# Patient Record
Sex: Female | Born: 1995 | Race: Black or African American | Hispanic: No | Marital: Single | Smoking: Never smoker
Health system: Southern US, Community
[De-identification: ages and names within clinical notes are randomized; demographics above are authoritative.]

---
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
## 2015-01-26 IMAGING — CT CT HEAD WITHOUT CONTRAST
1 series · 16 of 30 positions shown, 20 images · non-contrast
Comparison: None.

CLINICAL DATA: Possible seizure

EXAM:
CT HEAD WITHOUT CONTRAST
TECHNIQUE: Contiguous axial images were obtained from the base of the skull
through the vertex without intravenous contrast.

[Series 2: head wo · axial · 0.40mm/px · z∈[-181,-55]mm · 16 of 32 slices shown, 20 images]
[im 2/32  brain]
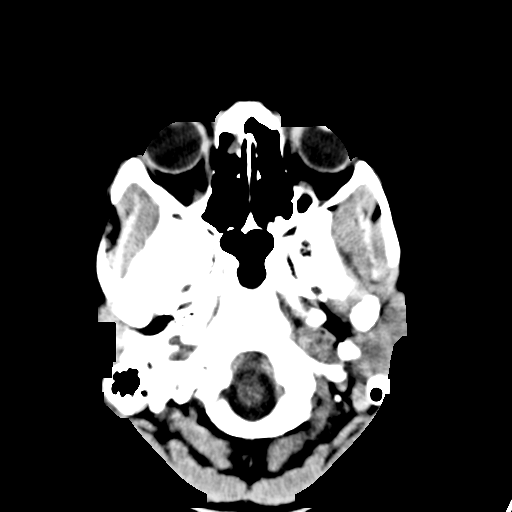
[im 2/32  bone]
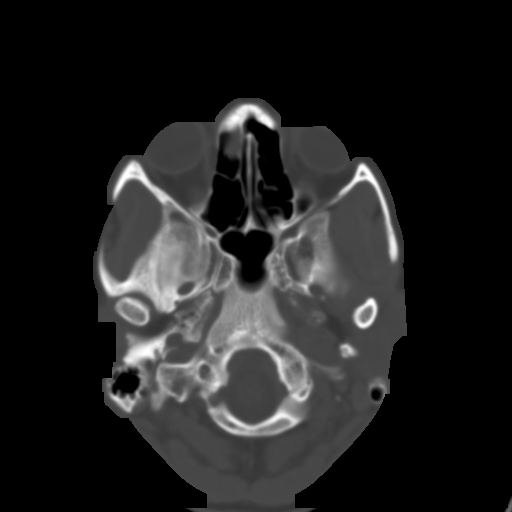
[im 4/32  brain]
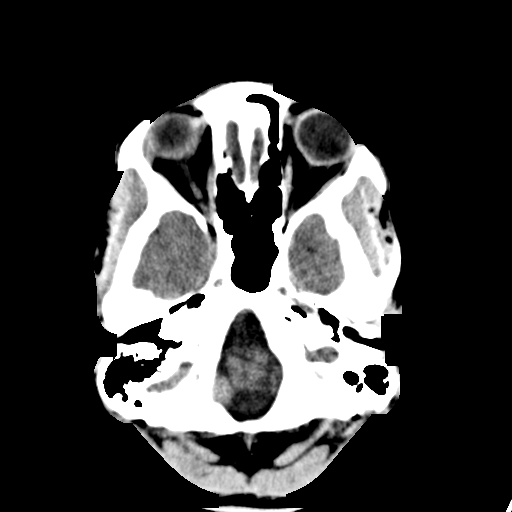
[im 6/32  brain]
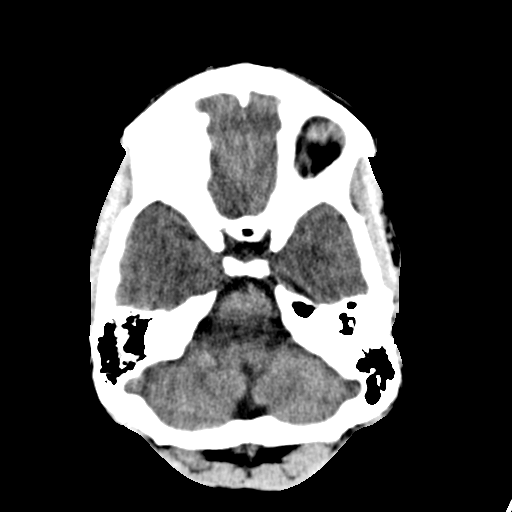
[im 8/32  brain]
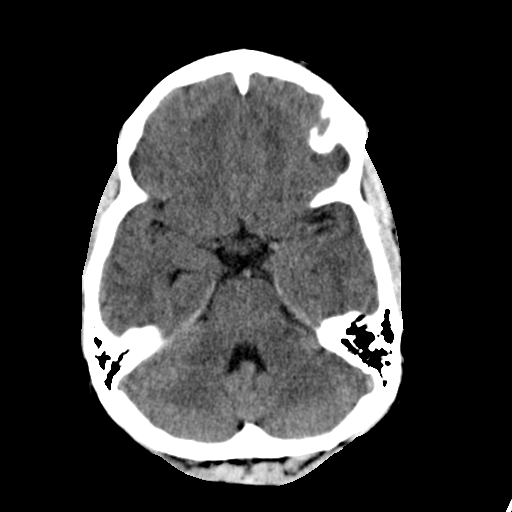
[im 9/32  brain]
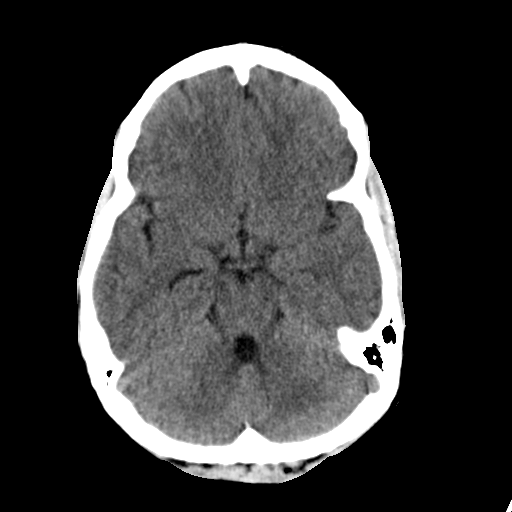
[im 9/32  bone]
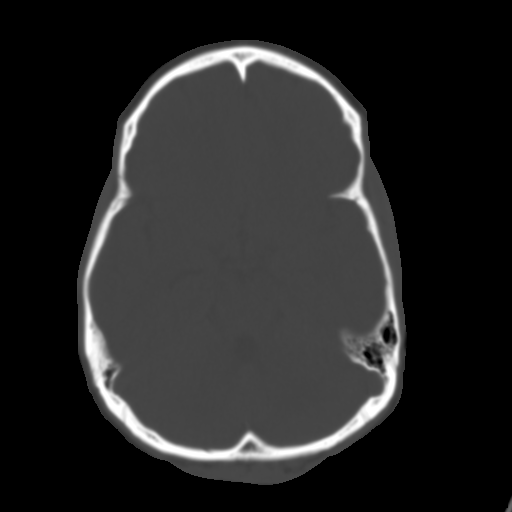
[im 11/32  brain]
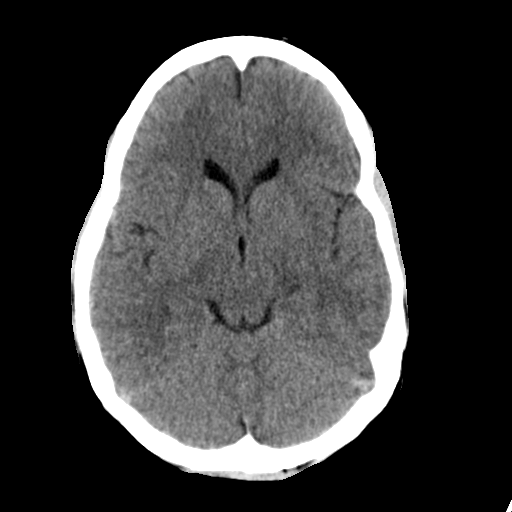
[im 13/32  brain]
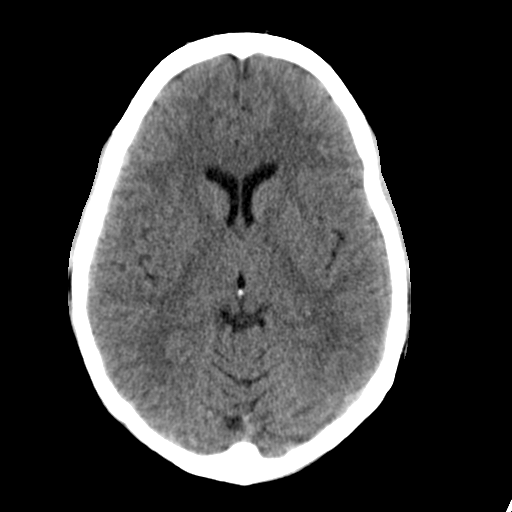
[im 15/32  brain]
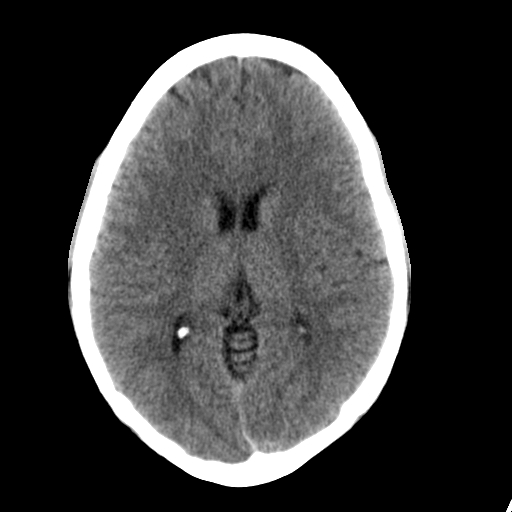
[im 17/32  brain]
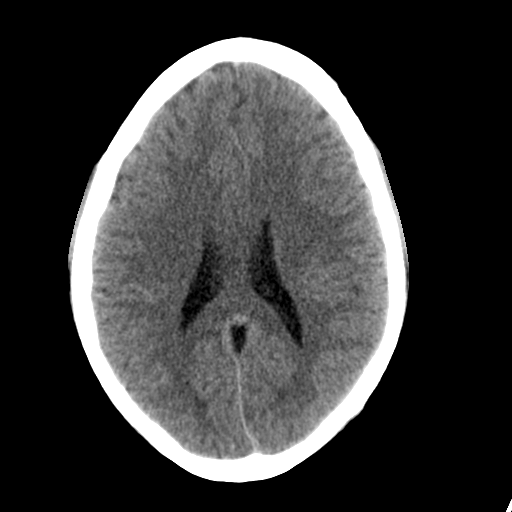
[im 17/32  bone]
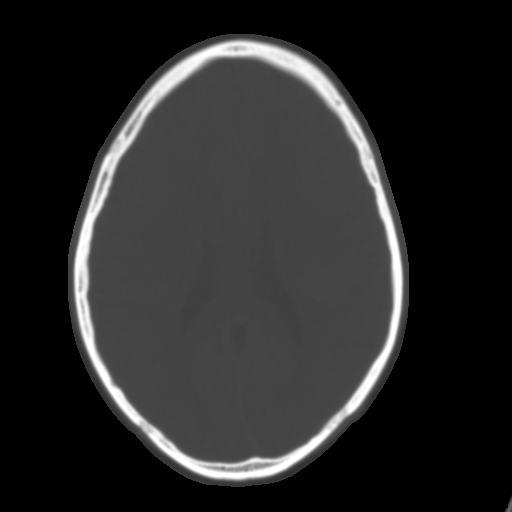
[im 19/32  brain]
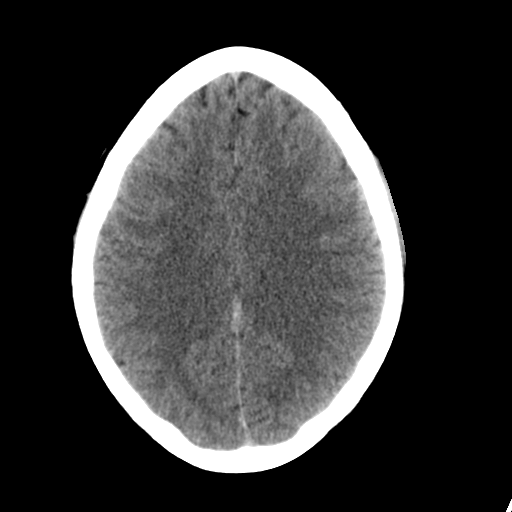
[im 21/32  brain]
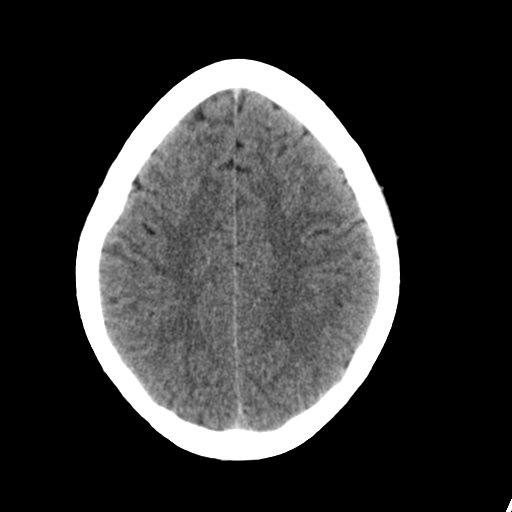
[im 23/32  brain]
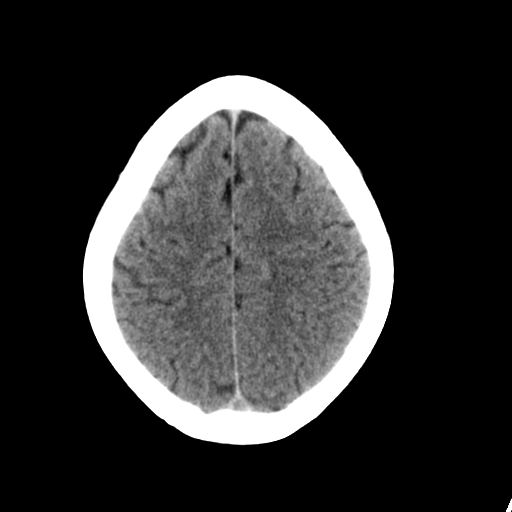
[im 24/32  brain]
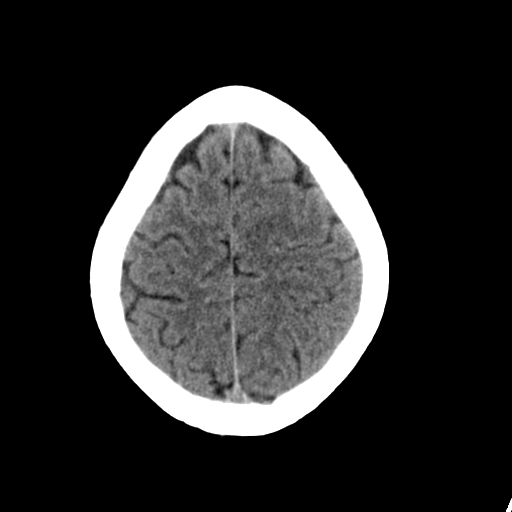
[im 24/32  bone]
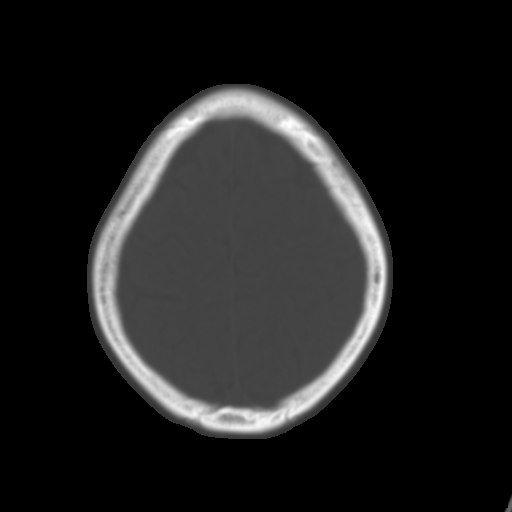
[im 26/32  brain]
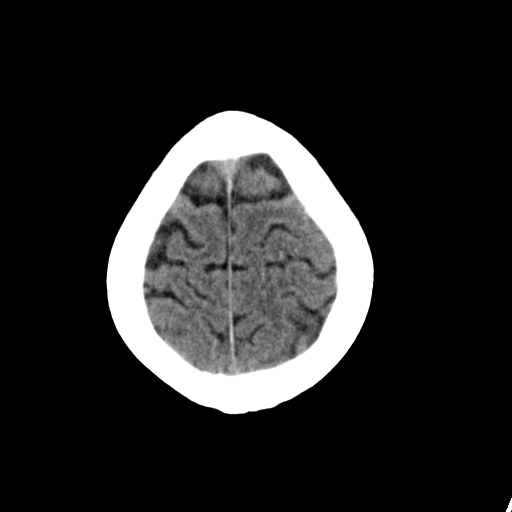
[im 28/32  brain]
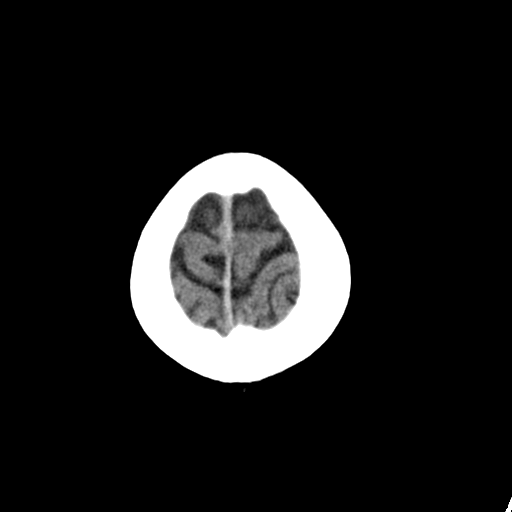
[im 30/32  brain]
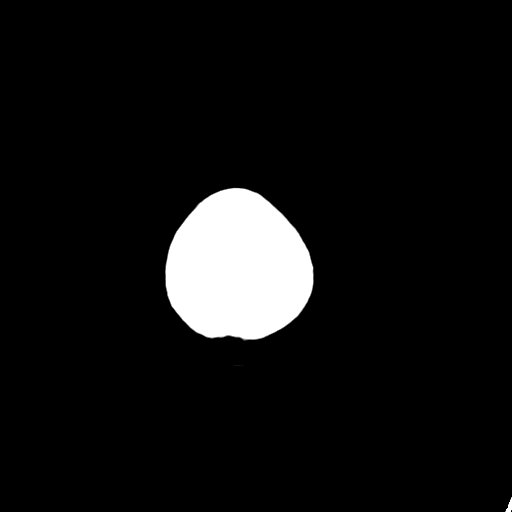

[16 of 30 positions shown; findings below may reference images not displayed]

FINDINGS: No evidence of parenchymal hemorrhage or extra-axial fluid
collection.

No mass lesion, mass effect, or midline shift.

Cerebral volume is within normal limits.  No ventriculomegaly.

The visualized paranasal sinuses are essentially clear. The mastoid
air cells are unopacified.

No evidence of calvarial fracture.
IMPRESSION: Normal head CT.

## 2017-05-19 IMAGING — US US TRANSVAGINAL NON-OB
1 series · 14 of 25 positions shown · non-contrast
Comparison: None

CLINICAL DATA: Suprapubic pelvic pain for 1 month.  LMP 12/09/2014.

EXAM:
TRANSABDOMINAL AND TRANSVAGINAL ULTRASOUND OF PELVIS
TECHNIQUE: Both transabdominal and transvaginal ultrasound examinations of the
pelvis were performed. Transabdominal technique was performed for
global imaging of the pelvis including uterus, ovaries, adnexal
regions, and pelvic cul-de-sac. It was necessary to proceed with
endovaginal exam following the transabdominal exam to visualize the
endometrial stripe and ovaries.

[Series 1: us transvaginal non-ob · 0.18mm/px · 14 of 89 slices shown]
[im 1/89]
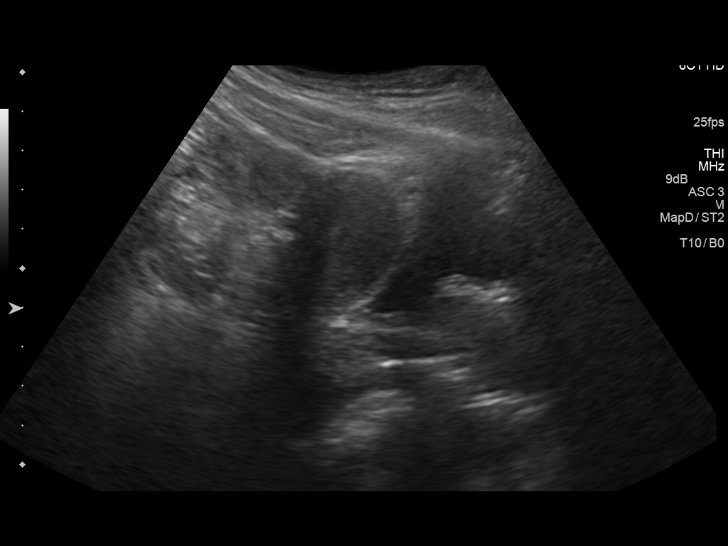
[im 8/89]
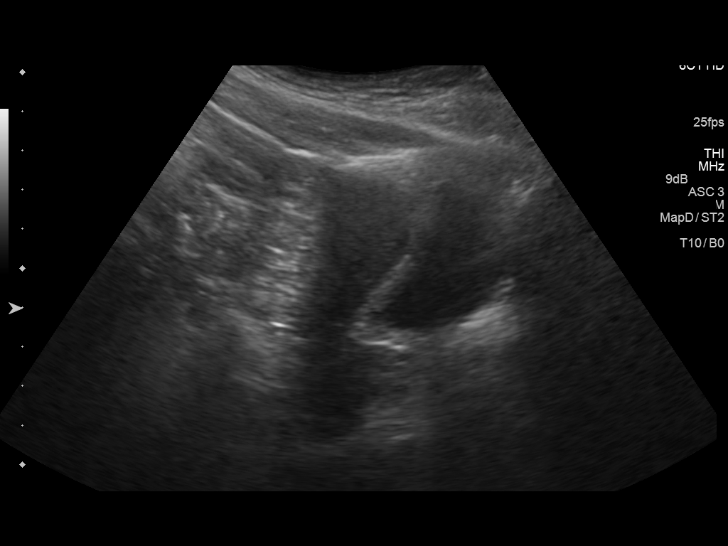
[im 15/89]
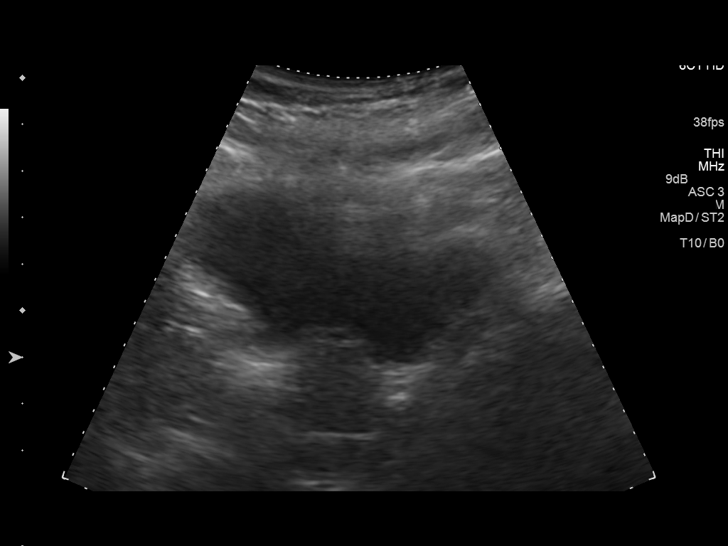
[im 23/89]
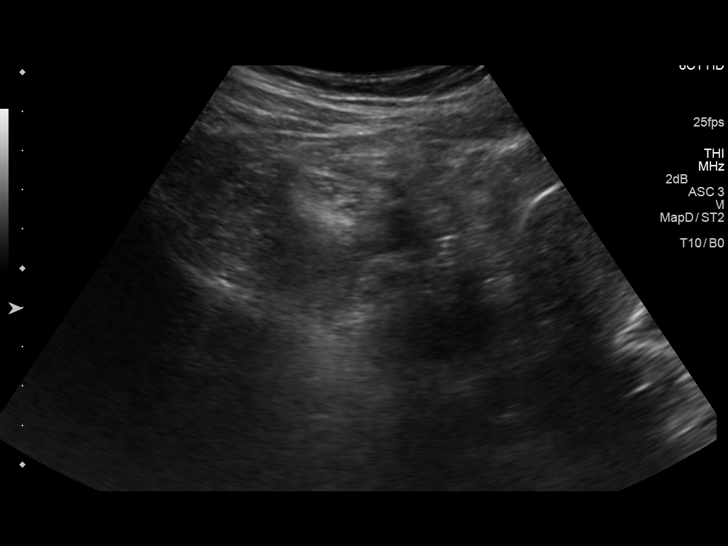
[im 30/89]
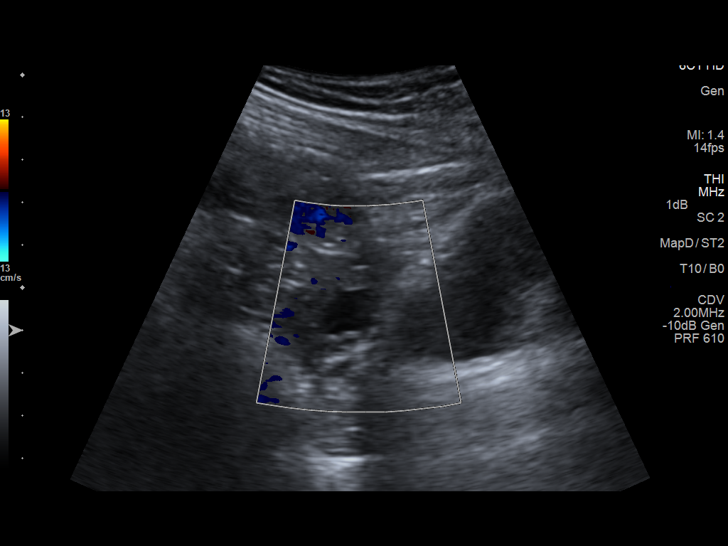
[im 34/89]
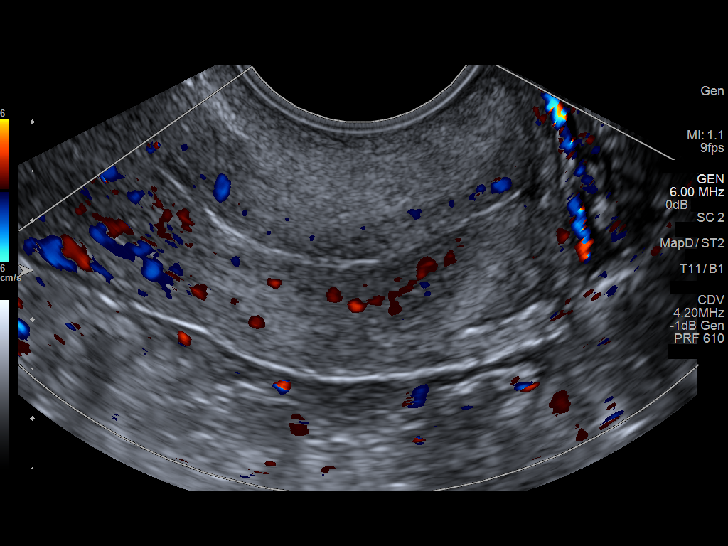
[im 41/89]
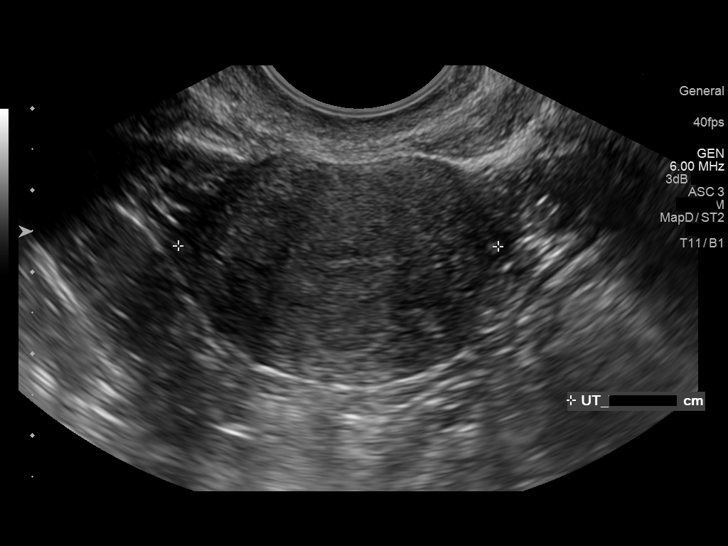
[im 48/89]
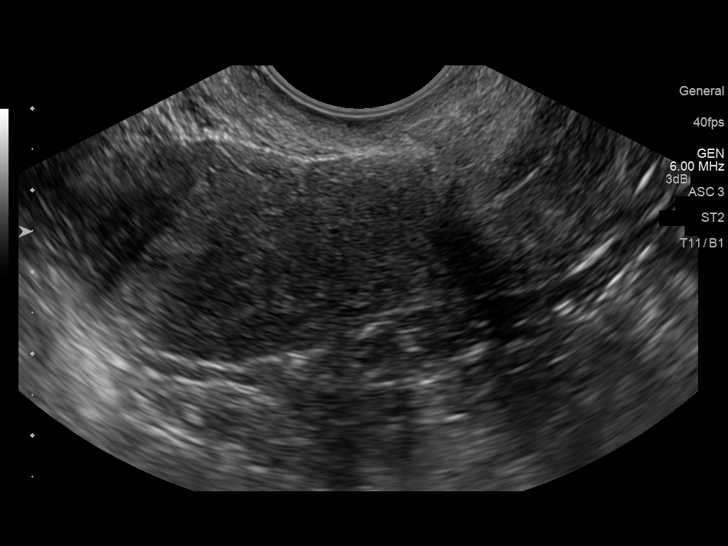
[im 56/89]
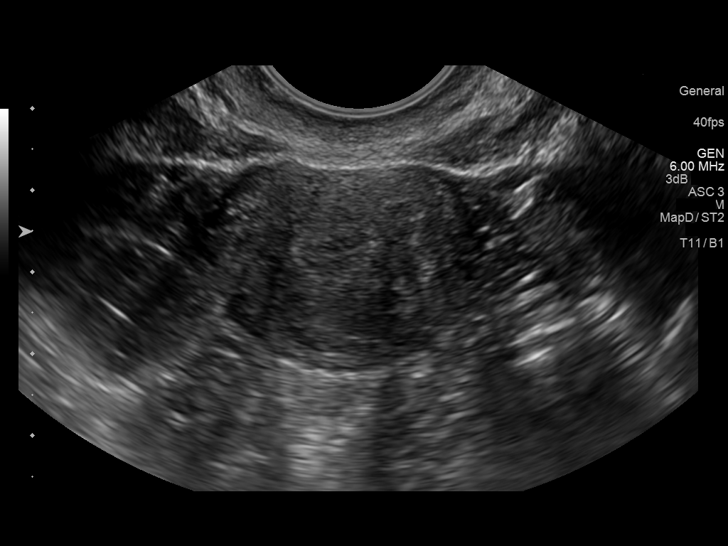
[im 59/89]
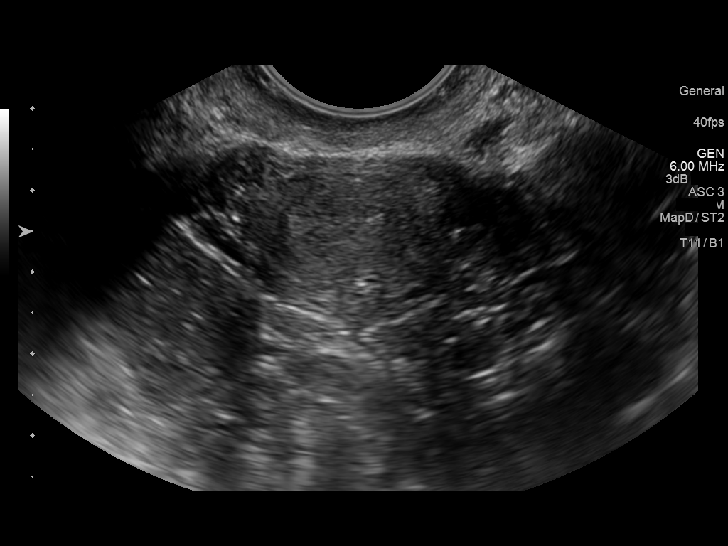
[im 67/89]
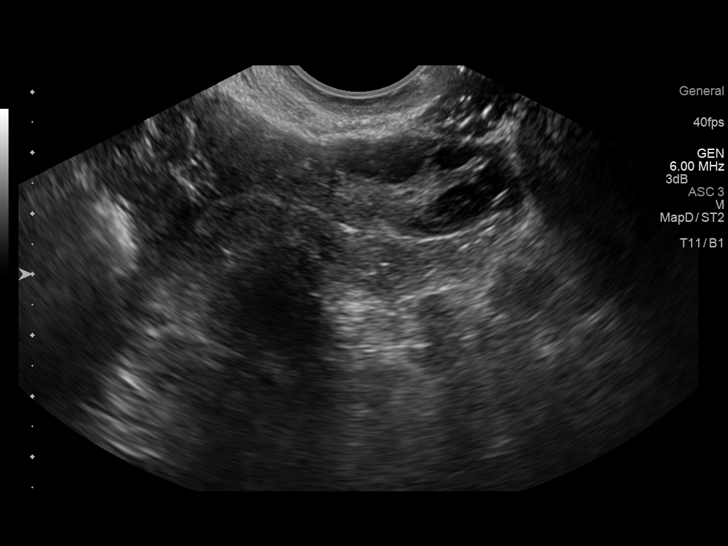
[im 74/89]
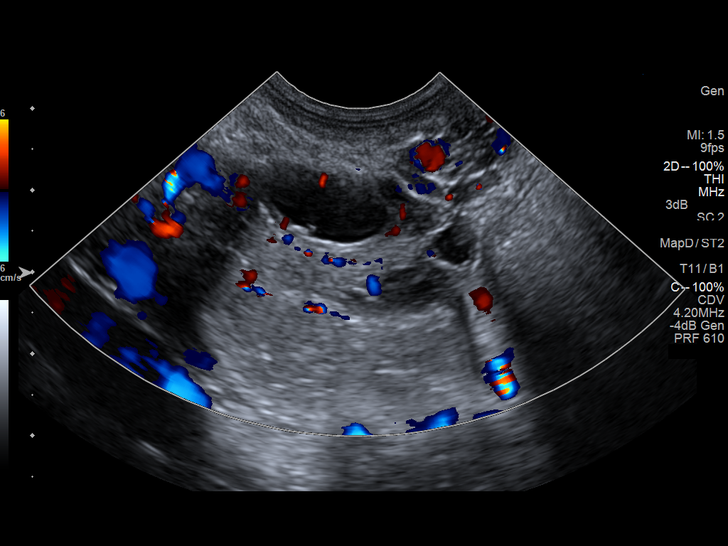
[im 81/89]
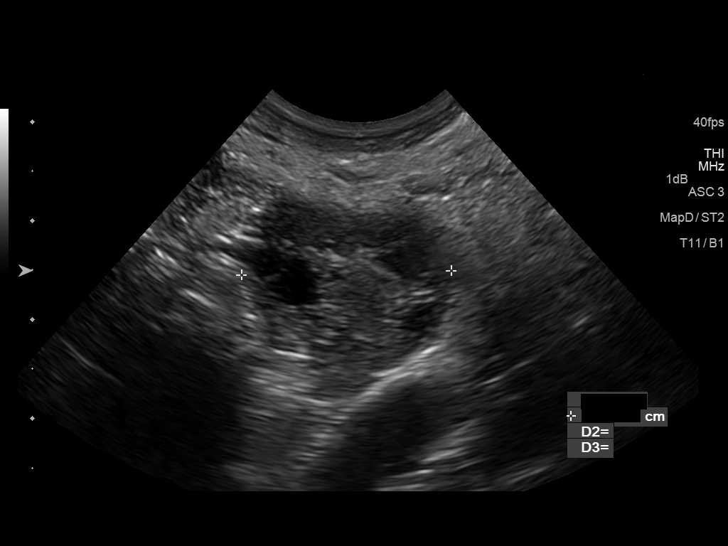
[im 89/89]
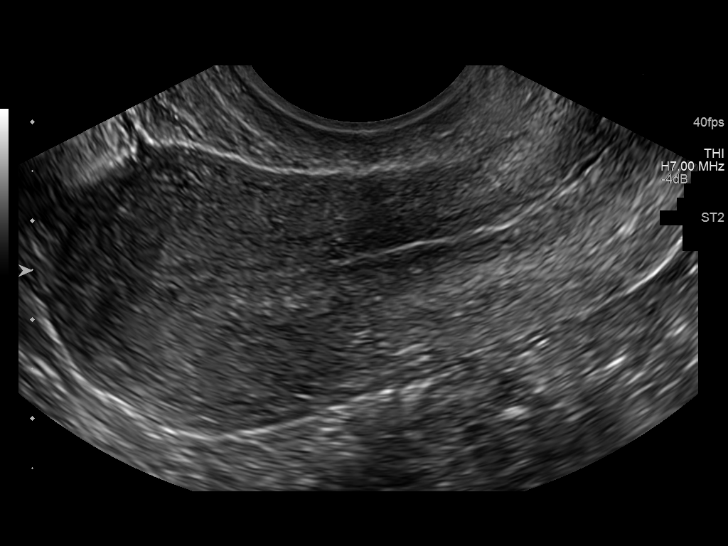

[14 of 25 positions shown; findings below may reference images not displayed]

FINDINGS: Uterus

Measurements: 7.1 x 2.9 x 4.3 cm. No fibroids or other mass
visualized.

Endometrium

Thickness: 4 mm.  No focal abnormality visualized.

Right ovary

Measurements: 3.0 x 1.5 x 2.6 cm. Normal appearance/no adnexal mass.

Left ovary

Measurements: 2.3 x 2.1 x 1.8 cm. Normal appearance/no adnexal mass.

Other findings

No free fluid.
IMPRESSION: Normal appearance of uterus and both ovaries. No pelvic mass or
other sonographic abnormality identified.
